# Patient Record
Sex: Female | Born: 1980 | Race: White | Hispanic: No | Marital: Single | Smoking: Current every day smoker
Health system: Southern US, Community
[De-identification: ages and names within clinical notes are randomized; demographics above are authoritative.]

## PROBLEM LIST (undated history)

## (undated) DIAGNOSIS — K760 Fatty (change of) liver, not elsewhere classified: Secondary | ICD-10-CM

## (undated) DIAGNOSIS — R918 Other nonspecific abnormal finding of lung field: Secondary | ICD-10-CM

## (undated) HISTORY — PX: CHOLECYSTECTOMY: SHX55

---

## 2014-03-10 ENCOUNTER — Emergency Department (HOSPITAL_COMMUNITY)
Admission: EM | Admit: 2014-03-10 | Discharge: 2014-03-10 | Disposition: A | Discharge status: Home / Self Care | Payer: No Typology Code available for payment source | Attending: Emergency Medicine | Admitting: Emergency Medicine

## 2014-03-10 ENCOUNTER — Encounter (HOSPITAL_COMMUNITY): Payer: Self-pay | Admitting: Emergency Medicine

## 2014-03-10 ENCOUNTER — Emergency Department (HOSPITAL_COMMUNITY): Payer: No Typology Code available for payment source

## 2014-03-10 DIAGNOSIS — S060X0A Concussion without loss of consciousness, initial encounter: Secondary | ICD-10-CM | POA: Insufficient documentation

## 2014-03-10 DIAGNOSIS — S058X9A Other injuries of unspecified eye and orbit, initial encounter: Secondary | ICD-10-CM | POA: Insufficient documentation

## 2014-03-10 DIAGNOSIS — S6990XA Unspecified injury of unspecified wrist, hand and finger(s), initial encounter: Secondary | ICD-10-CM

## 2014-03-10 DIAGNOSIS — S0180XA Unspecified open wound of other part of head, initial encounter: Secondary | ICD-10-CM | POA: Insufficient documentation

## 2014-03-10 DIAGNOSIS — F17200 Nicotine dependence, unspecified, uncomplicated: Secondary | ICD-10-CM | POA: Insufficient documentation

## 2014-03-10 DIAGNOSIS — Y9389 Activity, other specified: Secondary | ICD-10-CM | POA: Insufficient documentation

## 2014-03-10 DIAGNOSIS — S59909A Unspecified injury of unspecified elbow, initial encounter: Secondary | ICD-10-CM | POA: Insufficient documentation

## 2014-03-10 DIAGNOSIS — S0500XA Injury of conjunctiva and corneal abrasion without foreign body, unspecified eye, initial encounter: Secondary | ICD-10-CM

## 2014-03-10 DIAGNOSIS — S060X9A Concussion with loss of consciousness of unspecified duration, initial encounter: Secondary | ICD-10-CM

## 2014-03-10 DIAGNOSIS — S59919A Unspecified injury of unspecified forearm, initial encounter: Secondary | ICD-10-CM

## 2014-03-10 DIAGNOSIS — H209 Unspecified iridocyclitis: Secondary | ICD-10-CM | POA: Insufficient documentation

## 2014-03-10 DIAGNOSIS — Z79899 Other long term (current) drug therapy: Secondary | ICD-10-CM | POA: Insufficient documentation

## 2014-03-10 DIAGNOSIS — S060XAA Concussion with loss of consciousness status unknown, initial encounter: Secondary | ICD-10-CM

## 2014-03-10 DIAGNOSIS — Y92410 Unspecified street and highway as the place of occurrence of the external cause: Secondary | ICD-10-CM | POA: Insufficient documentation

## 2014-03-10 LAB — I-STAT CHEM 8, ED
BUN: 4 mg/dL — ABNORMAL LOW (ref 6–23)
Calcium, Ion: 1.23 mmol/L (ref 1.12–1.23)
Chloride: 106 mEq/L (ref 96–112)
Creatinine, Ser: 0.8 mg/dL (ref 0.50–1.10)
Glucose, Bld: 80 mg/dL (ref 70–99)
HCT: 41 % (ref 36.0–46.0)
HEMOGLOBIN: 13.9 g/dL (ref 12.0–15.0)
POTASSIUM: 4.3 meq/L (ref 3.7–5.3)
SODIUM: 142 meq/L (ref 137–147)
TCO2: 26 mmol/L (ref 0–100)

## 2014-03-10 MED ORDER — TETRACAINE HCL 0.5 % OP SOLN
1.0000 [drp] | Freq: Once | OPHTHALMIC | Status: AC
  Administered 2014-03-10: 1 [drp] via OPHTHALMIC
  Filled 2014-03-10: qty 2

## 2014-03-10 MED ORDER — HYDROMORPHONE HCL PF 1 MG/ML IJ SOLN
1.0000 mg | Freq: Once | INTRAMUSCULAR | Status: AC
  Administered 2014-03-10: 1 mg via INTRAVENOUS
  Filled 2014-03-10: qty 1

## 2014-03-10 MED ORDER — KETOROLAC TROMETHAMINE 30 MG/ML IJ SOLN
30.0000 mg | Freq: Once | INTRAMUSCULAR | Status: AC
  Administered 2014-03-10: 30 mg via INTRAVENOUS
  Filled 2014-03-10: qty 1

## 2014-03-10 MED ORDER — FLUORESCEIN SODIUM 1 MG OP STRP
1.0000 | ORAL_STRIP | Freq: Once | OPHTHALMIC | Status: AC
  Administered 2014-03-10: 1 via OPHTHALMIC
  Filled 2014-03-10: qty 1

## 2014-03-10 MED ORDER — CYCLOPENTOLATE HCL 1 % OP SOLN
2.0000 [drp] | Freq: Once | OPHTHALMIC | Status: AC
  Administered 2014-03-10: 2 [drp] via OPHTHALMIC
  Filled 2014-03-10: qty 2

## 2014-03-10 MED ORDER — PROCHLORPERAZINE EDISYLATE 5 MG/ML IJ SOLN
10.0000 mg | Freq: Once | INTRAMUSCULAR | Status: AC
  Administered 2014-03-10: 10 mg via INTRAVENOUS
  Filled 2014-03-10: qty 2

## 2014-03-10 MED ORDER — HYDROCODONE-ACETAMINOPHEN 5-325 MG PO TABS: 1.0000 | ORAL_TABLET | Freq: Four times a day (QID) | ORAL | Status: DC | PRN

## 2014-03-10 MED ORDER — ERYTHROMYCIN 5 MG/GM OP OINT: 1.0000 "application " | TOPICAL_OINTMENT | Freq: Four times a day (QID) | OPHTHALMIC | Status: AC

## 2014-03-10 MED ORDER — DIPHENHYDRAMINE HCL 50 MG/ML IJ SOLN
25.0000 mg | Freq: Once | INTRAMUSCULAR | Status: AC
  Administered 2014-03-10: 25 mg via INTRAVENOUS
  Filled 2014-03-10: qty 1

## 2014-03-10 NOTE — Discharge Instructions (Signed)
Iritis °Iritis is an inflammation of the colored part of the eye (iris). Other parts at the front of the eye may also be inflamed. The iris is part of the middle layer of the eyeball which is called the uvea or the uveal track. Any part of the uveal track can become inflamed. The other portions of the uveal track are the choroid (the thin membrane under the outer layer of the eye), and the ciliary body (joins the choroid and the iris and produces the fluid in the front of the eye).  °It is extremely important to treat iritis early, as it may lead to internal eye damage causing scarring or diseases such as glaucoma. Some people have only one attack of iritis (in one or both eyes) in their lifetime, while others may get it many times. °CAUSES °Iritis can be associated with many different diseases, but mostly occurs in otherwise healthy people. Examples of diseases that can be associated with iritis include: °· Diseases where the body's immune system attacks tissues within your own body (autoimmune diseases). °· Infections (tuberculosis, gonorrhea, fungus infections, Lyme disease, infection of the lining of the heart). °· Trauma or injury. °· Eye diseases (acute glaucoma and others). °· Inflammation from other parts of the uveal track. °· Severe eye infections. °· Other rare diseases. °SYMPTOMS °· Eye pain or aching. °· Sensitivity to light. °· Loss of sight or blurred vision. °· Redness of the eye. This is often accompanied by a ring of redness around the outside of the cornea, or clear covering at the front of the eye (ciliary flush). °· Excessive tearing of the eye(s). °· A small pupil that does not enlarge in the dark and stays smaller than the other eye's pupil. °· A whitish area that obscures the lower part of the colored circular iris. Sometimes this is visible when looking at the eye, where the whitish area has a "fluid level" or flat top. This is called a "hypopyon" and is actually pus inside the eye. °Since  iritis causes the eye to become red, it is often confused with a much less dangerous form of "pink eye" or conjunctivitis. One of the most important symptoms is sensitivity to light. Anytime there is redness, discomfort in the eye(s) and extreme light sensitivity, it is extremely important to see an ophthalmologist as soon as possible. °TREATMENT °Acute iritis requires prompt medical evaluation by an eye specialist (ophthalmologist.) Treatment depends on the underlying cause but may include: °· Corticosteroid eye drops and dilating eye drops. Follow your caregiver's exact instructions on taking and stopping corticosteroid medications (drops or pills). °· Occasionally, the iritis will be so severe that it will not respond to commonly used medications. If this happens, it may be necessary to use steroid injections. The injections are given under the eye's outer surface. Sometimes oral medications are given. The decision on treatment used for iritis is usually made on an individual basis. °HOME CARE INSTRUCTIONS °Your care giver will give specific instructions regarding the use of eye medications or other medications. Be certain to follow all instructions in both taking and stopping the medications. °SEEK IMMEDIATE MEDICAL CARE IF: °· You have redness of one or both eye. °· You experience a great deal of light sensitivity. °· You have pain or aching in either eye. °MAKE SURE YOU:  °· Understand these instructions. °· Will watch your condition. °· Will get help right away if you are not doing well or get worse. °Document Released: 09/17/2005 Document Revised: 12/10/2011 Document   Reviewed: 03/07/2007 [employer] Patient Information 2014 [place], [place].  Corneal Abrasion The cornea is the clear covering at the front and center of the eye. When looking at the colored portion of the eye (iris), you are looking through the cornea. This very thin tissue is made up of many layers. The surface layer is a single layer of cells  (corneal epithelium) and is one of the most sensitive tissues in the body. If a scratch or injury causes the corneal epithelium to come off, it is called a corneal abrasion. If the injury extends to the tissues below the epithelium, the condition is called a corneal ulcer. CAUSES   Scratches.  Trauma.  Foreign body in the eye. Some people have recurrences of abrasions in the area of the original injury even after it has healed (recurrent erosion syndrome). Recurrent erosion syndrome generally improves and goes away with time. SYMPTOMS   Eye pain.  Difficulty or inability to keep the injured eye open.  The eye becomes very sensitive to light.  Recurrent erosions tend to happen suddenly, first thing in the morning, usually after waking up and opening the eye. DIAGNOSIS  Your health care provider can diagnose a corneal abrasion during an eye exam. Dye is usually placed in the eye using a drop or a small paper strip moistened by your tears. When the eye is examined with a special light, the abrasion shows up clearly because of the dye. TREATMENT   Small abrasions may be treated with antibiotic drops or ointment alone.  Usually a pressure patch is specially applied. Pressure patches prevent the eye from blinking, allowing the corneal epithelium to heal. A pressure patch also reduces the amount of pain present in the eye during healing. Most corneal abrasions heal within 2 3 days with no effect on vision. If the abrasion becomes infected and spreads to the deeper tissues of the cornea, a corneal ulcer can result. This is serious because it can cause corneal scarring. Corneal scars interfere with light passing through the cornea and cause a loss of vision in the involved eye. HOME CARE INSTRUCTIONS  Use medicine or ointment as directed. Only take over-the-counter or prescription medicines for pain, discomfort, or fever as directed by your health care provider.  Do not drive or operate  machinery while your eye is patched. Your ability to judge distances is impaired.  If your health care provider has given you a follow-up appointment, it is very important to keep that appointment. Not keeping the appointment could result in a severe eye infection or permanent loss of vision. If there is any problem keeping the appointment, let your health care provider know. SEEK MEDICAL CARE IF:   You have pain, light sensitivity, and a scratchy feeling in one eye or both eyes.  Your pressure patch keeps loosening up, and you can blink your eye under the patch after treatment.  Any kind of discharge develops from the eye after treatment or if the lids stick together in the morning.  You have the same symptoms in the morning as you did with the original abrasion days, weeks, or months after the abrasion healed. MAKE SURE YOU:   Understand these instructions.  Will watch your condition.  Will get help right away if you are not doing well or get worse. Document Released: 09/14/2000 Document Revised: 07/08/2013 Document Reviewed: 05/25/2013 [facility] Patient Information 2014 [place], [place].  Concussion, Adult A concussion, or closed-head injury, is a brain injury caused by a direct blow to the  head or by a quick and sudden movement (jolt) of the head or neck. Concussions are usually not life-threatening. Even so, the effects of a concussion can be serious. If you have had a concussion before, you are more likely to experience concussion-like symptoms after a direct blow to the head.  CAUSES   Direct blow to the head, such as from running into another player during a soccer game, being hit in a fight, or hitting your head on a hard surface.  A jolt of the head or neck that causes the brain to move back and forth inside the skull, such as in a car crash. SIGNS AND SYMPTOMS  The signs of a concussion can be hard to notice. Early on, they may be missed by you, family members, and health care  providers. You may look fine but act or feel differently. Symptoms are usually temporary, but they may last for days, weeks, or even longer. Some symptoms may appear right away while others may not show up for hours or days. Every head injury is different. Symptoms include:   Mild to moderate headaches that will not go away.  A feeling of pressure inside your head.  Having more trouble than usual:   Learning or remembering things you have heard.  Answering questions.  Paying attention or concentrating.   Organizing daily tasks.   Making decisions and solving problems.   Slowness in thinking, acting or reacting, speaking, or reading.   Getting lost or being easily confused.   Feeling tired all the time or lacking energy (fatigued).   Feeling drowsy.   Sleep disturbances.   Sleeping more than usual.   Sleeping less than usual.   Trouble falling asleep.   Trouble sleeping (insomnia).   Loss of balance or feeling lightheaded or dizzy.   Nausea or vomiting.   Numbness or tingling.   Increased sensitivity to:   Sounds.   Lights.   Distractions.   Vision problems or eyes that tire easily.   Diminished sense of taste or smell.   Ringing in the ears.   Mood changes such as feeling sad or anxious.   Becoming easily irritated or angry for little or no reason.   Lack of motivation.  Seeing or hearing things other people do not see or hear (hallucinations). DIAGNOSIS  Your health care provider can usually diagnose a concussion based on a description of your injury and symptoms. He or she will ask whether you passed out (lost consciousness) and whether you are having trouble remembering events that happened right before and during your injury.  Your evaluation might include:   A brain scan to look for signs of injury to the brain. Even if the test shows no injury, you may still have a concussion.   Blood tests to be sure other  problems are not present. TREATMENT   Concussions are usually treated in an emergency department, in urgent care, or at a clinic. You may need to stay in the hospital overnight for further treatment.   Tell your health care provider if you are taking any medicines, including prescription medicines, over-the-counter medicines, and natural remedies. Some medicines, such as blood thinners (anticoagulants) and aspirin, may increase the chance of complications. Also tell your health care provider whether you have had alcohol or are taking illegal drugs. This information may affect treatment.  Your health care provider will send you home with important instructions to follow.  How fast you will recover from a concussion depends on  many factors. These factors include how severe your concussion is, what part of your brain was injured, your age, and how healthy you were before the concussion.  Most people with mild injuries recover fully. Recovery can take time. In general, recovery is slower in older persons. Also, persons who have had a concussion in the past or have other medical problems may find that it takes longer to recover from their current injury. HOME CARE INSTRUCTIONS  General Instructions  Carefully follow the directions your health care provider gave you.  Only take over-the-counter or prescription medicines for pain, discomfort, or fever as directed by your health care provider.  Take only those medicines that your health care provider has approved.  Do not drink alcohol until your health care provider says you are well enough to do so. Alcohol and certain other drugs may slow your recovery and can put you at risk of further injury.  If it is harder than usual to remember things, write them down.  If you are easily distracted, try to do one thing at a time. For example, do not try to watch TV while fixing dinner.  Talk with family members or close friends when making important  decisions.  Keep all follow-up appointments. Repeated evaluation of your symptoms is recommended for your recovery.  Watch your symptoms and tell others to do the same. Complications sometimes occur after a concussion. Older adults with a brain injury may have a higher risk of serious complications such as of a blood clot on the brain.  Tell your teachers, school nurse, school counselor, coach, athletic trainer, or work Production designer, theatre/television/film about your injury, symptoms, and restrictions. Tell them about what you can or cannot do. They should watch for:   Increased problems with attention or concentration.   Increased difficulty remembering or learning new information.   Increased time needed to complete tasks or assignments.   Increased irritability or decreased ability to cope with stress.   Increased symptoms.   Rest. Rest helps the brain to heal. Make sure you:  Get plenty of sleep at night. Avoid staying up late at night.  Keep the same bedtime hours on weekends and weekdays.  Rest during the day. Take daytime naps or rest breaks when you feel tired.  Limit activities that require a lot of thought or concentration. These includes   Doing homework or job-related work.   Watching TV.   Working on the computer.  Avoid any situation where there is potential for another head injury (football, hockey, soccer, basketball, martial arts, downhill snow sports and horseback riding). Your condition will get worse every time you experience a concussion. You should avoid these activities until you are evaluated by the appropriate follow-up caregivers. Returning To Your Regular Activities You will need to return to your normal activities slowly, not all at once. You must give your body and brain enough time for recovery.  Do not return to sports or other athletic activities until your health care provider tells you it is safe to do so.  Ask your health care provider when you can drive, ride a  bicycle, or operate heavy machinery. Your ability to react may be slower after a brain injury. Never do these activities if you are dizzy.  Ask your health care provider about when you can return to work or school. Preventing Another Concussion It is very important to avoid another brain injury, especially before you have recovered. In rare cases, another injury can lead to permanent brain  damage, brain swelling, or death. The risk of this is greatest during the first 7 10 days after a head injury. Avoid injuries by:   Wearing a seat belt when riding in a car.   Drinking alcohol only in moderation.   Wearing a helmet when biking, skiing, skateboarding, skating, or doing similar activities.  Avoiding activities that could lead to a second concussion, such as contact or recreational sports, until your health care provider says it is OK.  Taking safety measures in your home.   Remove clutter and tripping hazards from floors and stairways.   Use grab bars in bathrooms and handrails by stairs.   Place non-slip mats on floors and in bathtubs.   Improve lighting in dim areas. SEEK MEDICAL CARE IF:   You have increased problems paying attention or concentrating.   You have increased difficulty remembering or learning new information.   You need more time to complete tasks or assignments than before.   You have increased irritability or decreased ability to cope with stress.  You have more symptoms than before. Seek medical care if you have any of the following symptoms for more than 2 weeks after your injury:   Lasting (chronic) headaches.   Dizziness or balance problems.   Nausea.  Vision problems.   Increased sensitivity to noise or light.   Depression or mood swings.   Anxiety or irritability.   Memory problems.   Difficulty concentrating or paying attention.   Sleep problems.   Feeling tired all the time. SEEK IMMEDIATE MEDICAL CARE IF:   You  have severe or worsening headaches. These may be a sign of a blood clot in the brain.  You have weakness (even if only in one hand, leg, or part of the face).  You have numbness.  You have decreased coordination.   You vomit repeatedly.  You have increased sleepiness.  One pupil is larger than the other.   You have convulsions.   You have slurred speech.   You have increased confusion. This may be a sign of a blood clot in the brain.  You have increased restlessness, agitation, or irritability.   You are unable to recognize people or places.   You have neck pain.   It is difficult to wake you up.   You have unusual behavior changes.   You lose consciousness. MAKE SURE YOU:   Understand these instructions.  Will watch your condition.  Will get help right away if you are not doing well or get worse. Document Released: 12/08/2003 Document Revised: 05/20/2013 Document Reviewed: 04/09/2013 [facility] Patient Information 2014 [place], [place].
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
## 2014-03-10 NOTE — ED Notes (Addendum)
Eye drops administered prior to discharge. Eye pad placed over left eye and secured. AVS explained thoroughly. Advised to return if eye were to drain continuously and advised to follow up with opthalmology. Ambulatory with steady gait. No other complaints at this time.
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
## 2014-03-10 NOTE — ED Notes (Signed)
Bed: WA22 Expected date:  Expected time:  Means of arrival:  Comments: 

## 2014-03-10 NOTE — ED Notes (Signed)
Pt reports decreased pain to head and reports my wrist/forearm "is okay."

## 2014-03-10 NOTE — ED Notes (Addendum)
PA at bedside assessing vision/eye. Will draw lab when complete.

## 2014-03-10 NOTE — ED Notes (Signed)
Patient returned from CT

## 2014-03-12 ENCOUNTER — Emergency Department (HOSPITAL_COMMUNITY)
Admission: EM | Admit: 2014-03-12 | Discharge: 2014-03-12 | Disposition: A | Discharge status: Home / Self Care | Payer: No Typology Code available for payment source | Attending: Emergency Medicine | Admitting: Emergency Medicine

## 2014-03-12 ENCOUNTER — Encounter (HOSPITAL_COMMUNITY): Payer: Self-pay | Admitting: Emergency Medicine

## 2014-03-12 DIAGNOSIS — R51 Headache: Secondary | ICD-10-CM | POA: Insufficient documentation

## 2014-03-12 DIAGNOSIS — H209 Unspecified iridocyclitis: Secondary | ICD-10-CM | POA: Insufficient documentation

## 2014-03-12 DIAGNOSIS — Z79899 Other long term (current) drug therapy: Secondary | ICD-10-CM | POA: Insufficient documentation

## 2014-03-12 DIAGNOSIS — F17200 Nicotine dependence, unspecified, uncomplicated: Secondary | ICD-10-CM | POA: Insufficient documentation

## 2014-03-12 MED ORDER — METOCLOPRAMIDE HCL 5 MG/ML IJ SOLN
10.0000 mg | Freq: Once | INTRAMUSCULAR | Status: AC
Start: 2014-03-12 — End: 2014-03-12
  Administered 2014-03-12: 10 mg via INTRAVENOUS
  Filled 2014-03-12: qty 2

## 2014-03-12 MED ORDER — HYDROMORPHONE HCL PF 1 MG/ML IJ SOLN
1.0000 mg | INTRAMUSCULAR | Status: DC | PRN
  Administered 2014-03-12: 1 mg via INTRAVENOUS
  Filled 2014-03-12: qty 1

## 2014-03-12 MED ORDER — SODIUM CHLORIDE 0.9 % IV SOLN
INTRAVENOUS | Status: DC
  Administered 2014-03-12: 09:00:00 via INTRAVENOUS

## 2014-03-12 NOTE — Discharge Instructions (Signed)
Iritis °Iritis is an inflammation of the colored part of the eye (iris). Other parts at the front of the eye may also be inflamed. The iris is part of the middle layer of the eyeball which is called the uvea or the uveal track. Any part of the uveal track can become inflamed. The other portions of the uveal track are the choroid (the thin membrane under the outer layer of the eye), and the ciliary body (joins the choroid and the iris and produces the fluid in the front of the eye).  °It is extremely important to treat iritis early, as it may lead to internal eye damage causing scarring or diseases such as glaucoma. Some people have only one attack of iritis (in one or both eyes) in their lifetime, while others may get it many times. °CAUSES °Iritis can be associated with many different diseases, but mostly occurs in otherwise healthy people. Examples of diseases that can be associated with iritis include: °· Diseases where the body's immune system attacks tissues within your own body (autoimmune diseases). °· Infections (tuberculosis, gonorrhea, fungus infections, Lyme disease, infection of the lining of the heart). °· Trauma or injury. °· Eye diseases (acute glaucoma and others). °· Inflammation from other parts of the uveal track. °· Severe eye infections. °· Other rare diseases. °SYMPTOMS °· Eye pain or aching. °· Sensitivity to light. °· Loss of sight or blurred vision. °· Redness of the eye. This is often accompanied by a ring of redness around the outside of the cornea, or clear covering at the front of the eye (ciliary flush). °· Excessive tearing of the eye(s). °· A small pupil that does not enlarge in the dark and stays smaller than the other eye's pupil. °· A whitish area that obscures the lower part of the colored circular iris. Sometimes this is visible when looking at the eye, where the whitish area has a "fluid level" or flat top. This is called a "hypopyon" and is actually pus inside the eye. °Since  iritis causes the eye to become red, it is often confused with a much less dangerous form of "pink eye" or conjunctivitis. One of the most important symptoms is sensitivity to light. Anytime there is redness, discomfort in the eye(s) and extreme light sensitivity, it is extremely important to see an ophthalmologist as soon as possible. °TREATMENT °Acute iritis requires prompt medical evaluation by an eye specialist (ophthalmologist.) Treatment depends on the underlying cause but may include: °· Corticosteroid eye drops and dilating eye drops. Follow your caregiver's exact instructions on taking and stopping corticosteroid medications (drops or pills). °· Occasionally, the iritis will be so severe that it will not respond to commonly used medications. If this happens, it may be necessary to use steroid injections. The injections are given under the eye's outer surface. Sometimes oral medications are given. The decision on treatment used for iritis is usually made on an individual basis. °HOME CARE INSTRUCTIONS °Your care giver will give specific instructions regarding the use of eye medications or other medications. Be certain to follow all instructions in both taking and stopping the medications. °SEEK IMMEDIATE MEDICAL CARE IF: °· You have redness of one or both eye. °· You experience a great deal of light sensitivity. °· You have pain or aching in either eye. °MAKE SURE YOU:  °· Understand these instructions. °· Will watch your condition. °· Will get help right away if you are not doing well or get worse. °Document Released: 09/17/2005 Document Revised: 12/10/2011 Document   Reviewed: 03/07/2007 °ExitCare® Patient Information ©2014 [employer]. ° °
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
## 2014-03-12 NOTE — ED Provider Notes (Signed)
Medical screening examination/treatment/procedure(s) were performed by non-physician practitioner and as supervising physician I was immediately available for consultation/collaboration.   EKG Interpretation None        Gilda Creasehristopher J. Pollina, MD 03/12/14 1538

## 2014-03-20 ENCOUNTER — Encounter (HOSPITAL_COMMUNITY): Payer: Self-pay | Admitting: Emergency Medicine

## 2014-03-20 ENCOUNTER — Emergency Department (HOSPITAL_COMMUNITY): Payer: No Typology Code available for payment source

## 2014-03-20 ENCOUNTER — Emergency Department (HOSPITAL_COMMUNITY)
Admission: EM | Admit: 2014-03-20 | Discharge: 2014-03-20 | Disposition: A | Discharge status: Home / Self Care | Payer: No Typology Code available for payment source | Attending: Emergency Medicine | Admitting: Emergency Medicine

## 2014-03-20 DIAGNOSIS — R1013 Epigastric pain: Secondary | ICD-10-CM

## 2014-03-20 DIAGNOSIS — Z792 Long term (current) use of antibiotics: Secondary | ICD-10-CM | POA: Insufficient documentation

## 2014-03-20 DIAGNOSIS — Z79899 Other long term (current) drug therapy: Secondary | ICD-10-CM | POA: Insufficient documentation

## 2014-03-20 DIAGNOSIS — F17200 Nicotine dependence, unspecified, uncomplicated: Secondary | ICD-10-CM | POA: Insufficient documentation

## 2014-03-20 DIAGNOSIS — Z3202 Encounter for pregnancy test, result negative: Secondary | ICD-10-CM | POA: Insufficient documentation

## 2014-03-20 DIAGNOSIS — K279 Peptic ulcer, site unspecified, unspecified as acute or chronic, without hemorrhage or perforation: Secondary | ICD-10-CM | POA: Insufficient documentation

## 2014-03-20 LAB — COMPREHENSIVE METABOLIC PANEL
ALT: 11 U/L (ref 0–35)
AST: 14 U/L (ref 0–37)
Albumin: 4.3 g/dL (ref 3.5–5.2)
Alkaline Phosphatase: 61 U/L (ref 39–117)
BUN: 6 mg/dL (ref 6–23)
CHLORIDE: 103 meq/L (ref 96–112)
CO2: 21 meq/L (ref 19–32)
Calcium: 9.7 mg/dL (ref 8.4–10.5)
Creatinine, Ser: 0.7 mg/dL (ref 0.50–1.10)
GFR calc Af Amer: >90 mL/min (ref >90)
GFR calc non Af Amer: >90 mL/min (ref >90)
Glucose, Bld: 74 mg/dL (ref 70–99)
Potassium: 3.7 mEq/L (ref 3.7–5.3)
SODIUM: 139 meq/L (ref 137–147)
TOTAL PROTEIN: 7.1 g/dL (ref 6.0–8.3)
Total Bilirubin: <0.2 mg/dL — ABNORMAL LOW (ref 0.3–1.2)

## 2014-03-20 LAB — URINALYSIS, ROUTINE W REFLEX MICROSCOPIC
Bilirubin Urine: NEGATIVE
Glucose, UA: NEGATIVE mg/dL
Ketones, ur: NEGATIVE mg/dL
Leukocytes, UA: NEGATIVE
NITRITE: NEGATIVE
PH: 6 (ref 5.0–8.0)
Protein, ur: NEGATIVE mg/dL
Specific Gravity, Urine: 1.005 (ref 1.005–1.030)
Urobilinogen, UA: 0.2 mg/dL (ref 0.0–1.0)

## 2014-03-20 LAB — CBC WITH DIFFERENTIAL/PLATELET
Basophils Absolute: 0.1 10*3/uL (ref 0.0–0.1)
Basophils Relative: 1 % (ref 0–1)
EOS ABS: 0.1 10*3/uL (ref 0.0–0.7)
Eosinophils Relative: 1 % (ref 0–5)
HCT: 40.2 % (ref 36.0–46.0)
HEMOGLOBIN: 13.3 g/dL (ref 12.0–15.0)
LYMPHS ABS: 2.3 10*3/uL (ref 0.7–4.0)
LYMPHS PCT: 21 % (ref 12–46)
MCH: 30.6 pg (ref 26.0–34.0)
MCHC: 33.1 g/dL (ref 30.0–36.0)
MCV: 92.4 fL (ref 78.0–100.0)
Monocytes Absolute: 0.9 10*3/uL (ref 0.1–1.0)
Monocytes Relative: 8 % (ref 3–12)
NEUTROS ABS: 7.5 10*3/uL (ref 1.7–7.7)
NEUTROS PCT: 69 % (ref 43–77)
Platelets: 270 10*3/uL (ref 150–400)
RBC: 4.35 MIL/uL (ref 3.87–5.11)
RDW: 13.9 % (ref 11.5–15.5)
WBC: 10.9 10*3/uL — ABNORMAL HIGH (ref 4.0–10.5)

## 2014-03-20 LAB — URINE MICROSCOPIC-ADD ON

## 2014-03-20 LAB — POC URINE PREG, ED: Preg Test, Ur: NEGATIVE

## 2014-03-20 LAB — LIPASE, BLOOD: Lipase: 40 U/L (ref 11–59)

## 2014-03-20 MED ORDER — MORPHINE SULFATE 4 MG/ML IJ SOLN
4.0000 mg | Freq: Once | INTRAMUSCULAR | Status: AC
  Administered 2014-03-20: 4 mg via INTRAVENOUS
  Filled 2014-03-20: qty 1

## 2014-03-20 MED ORDER — HYDROCODONE-ACETAMINOPHEN 5-325 MG PO TABS: 1.0000 | ORAL_TABLET | ORAL | Status: DC | PRN

## 2014-03-20 MED ORDER — PROMETHAZINE HCL 25 MG/ML IJ SOLN
12.5000 mg | Freq: Once | INTRAMUSCULAR | Status: AC
  Administered 2014-03-20: 12.5 mg via INTRAVENOUS
  Filled 2014-03-20: qty 1

## 2014-03-20 MED ORDER — FAMOTIDINE IN NACL 20-0.9 MG/50ML-% IV SOLN
20.0000 mg | Freq: Once | INTRAVENOUS | Status: AC
  Administered 2014-03-20: 20 mg via INTRAVENOUS
  Filled 2014-03-20: qty 50

## 2014-03-20 MED ORDER — METOCLOPRAMIDE HCL 5 MG/ML IJ SOLN
10.0000 mg | Freq: Once | INTRAMUSCULAR | Status: AC
  Administered 2014-03-20: 10 mg via INTRAVENOUS
  Filled 2014-03-20: qty 2

## 2014-03-20 MED ORDER — MORPHINE SULFATE 4 MG/ML IJ SOLN
4.0000 mg | INTRAMUSCULAR | Status: DC | PRN
  Administered 2014-03-20: 4 mg via INTRAVENOUS
  Filled 2014-03-20: qty 1

## 2014-03-20 MED ORDER — PROMETHAZINE HCL 12.5 MG PO TABS: 12.5000 mg | ORAL_TABLET | Freq: Four times a day (QID) | ORAL | Status: AC | PRN

## 2014-03-20 MED ORDER — SUCRALFATE 1 G PO TABS: 1.0000 g | ORAL_TABLET | Freq: Four times a day (QID) | ORAL | Status: AC

## 2014-03-20 MED ORDER — OMEPRAZOLE 20 MG PO CPDR: 20.0000 mg | DELAYED_RELEASE_CAPSULE | Freq: Two times a day (BID) | ORAL | Status: AC

## 2014-03-20 NOTE — ED Notes (Addendum)
Per pt, states RUQ pain radiating to back which started yesterday-states increased urination-states she has noticed blood when she wiped

## 2014-03-20 NOTE — Discharge Instructions (Signed)
Recheck in emergency room if your pain becomes worse. Recheck with any vomiting of blood, or passage of blood in your stool. [facility] the above information for a followup appointment  Abdominal Pain, Women Abdominal (stomach, pelvic, or belly) pain can be caused by many things. It is important to tell your doctor:  The location of the pain.  Does it come and go or is it present all the time?  Are there things that start the pain (eating certain foods, exercise)?  Are there other symptoms associated with the pain (fever, nausea, vomiting, diarrhea)? All of this is helpful to know when trying to find the cause of the pain. CAUSES   Stomach: virus or bacteria infection, or ulcer.  Intestine: appendicitis (inflamed appendix), regional ileitis (Crohn's disease), ulcerative colitis (inflamed colon), irritable bowel syndrome, diverticulitis (inflamed diverticulum of the colon), or cancer of the stomach or intestine.  Gallbladder disease or stones in the gallbladder.  Kidney disease, kidney stones, or infection.  Pancreas infection or cancer.  Fibromyalgia (pain disorder).  Diseases of the female organs:  Uterus: fibroid (non-cancerous) tumors or infection.  Fallopian tubes: infection or tubal pregnancy.  Ovary: cysts or tumors.  Pelvic adhesions (scar tissue).  Endometriosis (uterus lining tissue growing in the pelvis and on the pelvic organs).  Pelvic congestion syndrome (female organs filling up with blood just before the menstrual period).  Pain with the menstrual period.  Pain with ovulation (producing an egg).  Pain with an IUD (intrauterine device, birth control) in the uterus.  Cancer of the female organs.  Functional pain (pain not caused by a disease, may improve without treatment).  Psychological pain.  Depression. DIAGNOSIS  Your doctor will decide the seriousness of your pain by doing an examination.  Blood  tests.  X-rays.  Ultrasound.  CT scan (computed tomography, special type of X-ray).  MRI (magnetic resonance imaging).  Cultures, for infection.  Barium enema (dye inserted in the large intestine, to better view it with X-rays).  Colonoscopy (looking in intestine with a lighted tube).  Laparoscopy (minor surgery, looking in abdomen with a lighted tube).  Major abdominal exploratory surgery (looking in abdomen with a large incision). TREATMENT  The treatment will depend on the cause of the pain.   Many cases can be observed and treated at home.  Over-the-counter medicines recommended by your caregiver.  Prescription medicine.  Antibiotics, for infection.  Birth control pills, for painful periods or for ovulation pain.  Hormone treatment, for endometriosis.  Nerve blocking injections.  Physical therapy.  Antidepressants.  Counseling with a psychologist or psychiatrist.  Minor or major surgery. HOME CARE INSTRUCTIONS   Do not take laxatives, unless directed by your caregiver.  Take over-the-counter pain medicine only if ordered by your caregiver. Do not take aspirin because it can cause an upset stomach or bleeding.  Try a clear liquid diet (broth or water) as ordered by your caregiver. Slowly move to a bland diet, as tolerated, if the pain is related to the stomach or intestine.  Have a thermometer and take your temperature several times a day, and record it.  Bed rest and sleep, if it helps the pain.  Avoid sexual intercourse, if it causes pain.  Avoid stressful situations.  Keep your follow-up appointments and tests, as your caregiver orders.  If the pain does not go away with medicine or surgery, you may try:  Acupuncture.  Relaxation exercises (yoga, meditation).  Group therapy.  Counseling. SEEK MEDICAL CARE IF:  You notice certain foods cause stomach pain.  Your home care treatment is not helping your pain.  You need stronger pain  medicine.  You want your IUD removed.  You feel faint or lightheaded.  You develop nausea and vomiting.  You develop a rash.  You are having side effects or an allergy to your medicine. SEEK IMMEDIATE MEDICAL CARE IF:   Your pain does not go away or gets worse.  You have a fever.  Your pain is felt only in portions of the abdomen. The right side could possibly be appendicitis. The left lower portion of the abdomen could be colitis or diverticulitis.  You are passing blood in your stools (bright red or black tarry stools, with or without vomiting).  You have blood in your urine.  You develop chills, with or without a fever.  You pass out. MAKE SURE YOU:   Understand these instructions.  Will watch your condition.  Will get help right away if you are not doing well or get worse. Document Released: 07/15/2007 Document Revised: 12/10/2011 Document Reviewed: 08/04/2009 [facility] Patient Information 2015 [place], [place]. This information is not intended to replace advice given to you by your health care provider. Make sure you discuss any questions you have with your health care provider.  Peptic Ulcer A peptic ulcer is a painful sore in the lining of in your esophagus, stomach, or in the first part of your small intestine. The main causes of an ulcer can be:  An infection.  Using certain pain medicines too often or too much.  Smoking. HOME CARE  Avoid smoking, alcohol, and caffeine.  Avoid foods that bother you.  Only take medicine as told by your doctor. Do not take any medicines your doctor has not approved.  Keep all doctor visits as told. GET HELP RIGHT AWAY IF:  You do not get better in 7 days after starting treatment.  You keep having an upset stomach (indigestion) or heartburn.  You have sudden, sharp, or lasting belly (abdominal) pain.  You have bloody, black, or tarry poop (stool).  You throw up (vomit) blood or your throw up looks like coffee  grounds.  You get light headed, weak, or feel like you will pass out (faint).  You get sweaty or feel sticky and cold to the touch (clammy). MAKE SURE YOU:   Understand these instructions.  Will watch your condition.  Will get help right away if you are not doing well or get worse. Document Released: 12/12/2009 Document Revised: 06/11/2012 Document Reviewed: 04/16/2012 [facility] Patient Information 2015 [place], [place]. This information is not intended to replace advice given to you by your health care provider. Make sure you discuss any questions you have with your health care provider.

## 2014-03-20 NOTE — ED Provider Notes (Signed)
CSN: [id number]     Arrival date & time 03/20/14  1038 History   First MD Initiated Contact with Patient 03/20/14 1050     Chief Complaint  Patient presents with  . Abdominal Pain     HPI  Symptoms of right upper quadrant abdominal pain. Been present today. Present on awakening yesterday morning. Has had some nausea no vomiting. Normal bowel habits without diarrhea. No dysuria C. or hematuria. He states a spicy foods large meals or anything fried gives her pain in her upper abdomen. No other stools. Has not been having dark urine or light stools.  History reviewed. No pertinent past medical history. History reviewed. No pertinent past surgical history. No family history on file. History  Substance Use Topics  . Smoking status: Current Every Day Smoker -- 15 years    Types: Cigarettes  . Smokeless tobacco: Not on file  . Alcohol Use: No   OB History   Grav Para Term Preterm Abortions TAB SAB Ect Mult Living                 Review of Systems  Constitutional: Negative for fever, chills, diaphoresis, appetite change and fatigue.  HENT: Negative for mouth sores, sore throat and trouble swallowing.   Eyes: Negative for visual disturbance.  Respiratory: Negative for cough, chest tightness, shortness of breath and wheezing.   Cardiovascular: Negative for chest pain.  Gastrointestinal: Positive for nausea and abdominal pain. Negative for vomiting, diarrhea and abdominal distention.  Endocrine: Negative for polydipsia, polyphagia and polyuria.  Genitourinary: Negative for dysuria, frequency and hematuria.  Musculoskeletal: Negative for gait problem.  Skin: Negative for color change, pallor and rash.  Neurological: Negative for dizziness, syncope, light-headedness and headaches.  Hematological: Does not bruise/bleed easily.  Psychiatric/Behavioral: Negative for behavioral problems and confusion.      Allergies  Zofran  Home Medications   Prior to Admission medications     Medication Sig Start Date End Date Taking? Authorizing Provider  aspirin-acetaminophen-caffeine (EXCEDRIN MIGRAINE) [phone] MG per tablet Take 1 tablet by mouth every 6 (six) hours as needed for headache.   Yes Historical Provider, MD  Aspirin-Salicylamide-Caffeine (BC HEADACHE POWDER PO) Take 2 packets by mouth every 4 (four) hours as needed (for pain).   Yes Historical Provider, MD  erythromycin ophthalmic ointment Place 1 application into the left eye every 6 (six) hours. Place 1/2 inch ribbon of ointment in the affected eye 4 times a day 03/10/14  Yes Arthor CaptainAbigail Harris, PA-C  HYDROcodone-acetaminophen (NORCO) 5-325 MG per tablet Take 1-2 tablets by mouth every 6 (six) hours as needed for moderate pain. 03/10/14  Yes Arthor CaptainAbigail Harris, PA-C  ibuprofen (ADVIL,MOTRIN) 200 MG tablet Take 800 mg by mouth every 6 (six) hours as needed for mild pain or moderate pain.   Yes Historical Provider, MD  promethazine (PHENERGAN) 25 MG tablet Take 25 mg by mouth every 6 (six) hours as needed for nausea or vomiting.   Yes Historical Provider, MD  ranitidine (ZANTAC) 150 MG tablet Take 150 mg by mouth 2 (two) times daily.   Yes Historical Provider, MD  HYDROcodone-acetaminophen (NORCO/VICODIN) 5-325 MG per tablet Take 1 tablet by mouth every 4 (four) hours as needed. 03/20/14   Rolland PorterMark Rashed Edler, MD  omeprazole (PRILOSEC) 20 MG capsule Take 1 capsule (20 mg total) by mouth 2 (two) times daily. 03/20/14   Rolland PorterMark Catarina Huntley, MD  promethazine (PHENERGAN) 12.5 MG tablet Take 1 tablet (12.5 mg total) by mouth every 6 (six) hours as needed for nausea  or vomiting. 03/20/14   Rolland PorterMark Winefred Hillesheim, MD  sucralfate (CARAFATE) 1 G tablet Take 1 tablet (1 g total) by mouth 4 (four) times daily. 03/20/14   Rolland PorterMark Yomaris Palecek, MD   BP 96/65  Pulse 54  Temp(Src) 98.2 F (36.8 C) (Oral)  Resp 20  SpO2 100%  LMP 03/03/2014 Physical Exam  Constitutional: She is oriented to person, place, and time. She appears well-developed and well-nourished. No distress.  HENT:   Head: Normocephalic.  Eyes: Conjunctivae are normal. Pupils are equal, round, and reactive to light. No scleral icterus.  Neck: Normal range of motion. Neck supple. No thyromegaly present.  Cardiovascular: Normal rate and regular rhythm.  Exam reveals no gallop and no friction rub.   No murmur heard. Pulmonary/Chest: Effort normal and breath sounds normal. No respiratory distress. She has no wheezes. She has no rales.  Abdominal: Soft. Bowel sounds are normal. She exhibits no distension. There is tenderness in the right upper quadrant and epigastric area. There is no rebound.    Musculoskeletal: Normal range of motion.  Neurological: She is alert and oriented to person, place, and time.  Skin: Skin is warm and dry. No rash noted.  Psychiatric: She has a normal mood and affect. Her behavior is normal.    ED Course  Procedures (including critical care time) Labs Review Labs Reviewed  CBC WITH DIFFERENTIAL - Abnormal; Notable for the following:    WBC 10.9 (*)    All other components within normal limits  COMPREHENSIVE METABOLIC PANEL - Abnormal; Notable for the following:    Total Bilirubin <0.2 (*)    All other components within normal limits  URINALYSIS, ROUTINE W REFLEX MICROSCOPIC - Abnormal; Notable for the following:    APPearance CLOUDY (*)    Hgb urine dipstick SMALL (*)    All other components within normal limits  URINE MICROSCOPIC-ADD ON - Abnormal; Notable for the following:    Squamous Epithelial / LPF MANY (*)    All other components within normal limits  LIPASE, BLOOD  POC URINE PREG, ED    Imaging Review [place] Abdomen Limited Ruq  03/20/2014   CLINICAL DATA:  RUQ PAIN,? Cholecystitis  EXAM: US ABDOMEN LIMITED - RIGHT UPPER QUADRANT  COMPARISON:  None.  FINDINGS: Gallbladder:  No gallstones or wall thickening visualized, measuring 1.6 mm. No sonographic Murphy sign noted.  Common bile duct:  Diameter: 2.6 mm  Liver:  No focal lesion identified. Within normal limits  in parenchymal echogenicity.  IMPRESSION: Negative right upper quadrant ultrasound.   Electronically Signed   By: Salome HolmesHector  Cooper M.D.   On: 03/20/2014 12:44     EKG Interpretation None      MDM   Final diagnoses:  Epigastric pain  Peptic ulcer disease   Is improving. Labs and ultrasound are reassuring. Diagnosis is abdominal pain, probable peptic ulcer disease. The proton pump inhibitors, Carafate, pain meds, GI followup if not improving. Return precautions discussed.     Rolland PorterMark Ladarryl Wrage, MD 03/20/14 1409

## 2014-12-27 ENCOUNTER — Emergency Department (HOSPITAL_COMMUNITY)
Admission: EM | Admit: 2014-12-27 | Discharge: 2014-12-28 | Disposition: A | Discharge status: Home / Self Care | Payer: Self-pay | Attending: Emergency Medicine | Admitting: Emergency Medicine

## 2014-12-27 ENCOUNTER — Encounter (HOSPITAL_COMMUNITY): Payer: Self-pay | Admitting: Emergency Medicine

## 2014-12-27 ENCOUNTER — Emergency Department (HOSPITAL_COMMUNITY): Payer: No Typology Code available for payment source

## 2014-12-27 DIAGNOSIS — R202 Paresthesia of skin: Secondary | ICD-10-CM

## 2014-12-27 DIAGNOSIS — M79602 Pain in left arm: Secondary | ICD-10-CM | POA: Insufficient documentation

## 2014-12-27 DIAGNOSIS — M549 Dorsalgia, unspecified: Secondary | ICD-10-CM | POA: Insufficient documentation

## 2014-12-27 DIAGNOSIS — Z8719 Personal history of other diseases of the digestive system: Secondary | ICD-10-CM | POA: Insufficient documentation

## 2014-12-27 DIAGNOSIS — M79601 Pain in right arm: Secondary | ICD-10-CM | POA: Insufficient documentation

## 2014-12-27 DIAGNOSIS — R200 Anesthesia of skin: Secondary | ICD-10-CM | POA: Insufficient documentation

## 2014-12-27 DIAGNOSIS — Z79899 Other long term (current) drug therapy: Secondary | ICD-10-CM | POA: Insufficient documentation

## 2014-12-27 DIAGNOSIS — Z720 Tobacco use: Secondary | ICD-10-CM | POA: Insufficient documentation

## 2014-12-27 DIAGNOSIS — Z3202 Encounter for pregnancy test, result negative: Secondary | ICD-10-CM | POA: Insufficient documentation

## 2014-12-27 HISTORY — DX: Fatty (change of) liver, not elsewhere classified: K76.0

## 2014-12-27 HISTORY — DX: Other nonspecific abnormal finding of lung field: R91.8

## 2014-12-27 LAB — COMPREHENSIVE METABOLIC PANEL
ALBUMIN: 4.1 g/dL (ref 3.5–5.2)
ALT: 16 U/L (ref 0–35)
ANION GAP: 3 — AB (ref 5–15)
AST: 21 U/L (ref 0–37)
Alkaline Phosphatase: 54 U/L (ref 39–117)
BUN: <5 mg/dL — ABNORMAL LOW (ref 6–23)
CALCIUM: 9.5 mg/dL (ref 8.4–10.5)
CHLORIDE: 107 mmol/L (ref 96–112)
CO2: 30 mmol/L (ref 19–32)
Creatinine, Ser: 0.69 mg/dL (ref 0.50–1.10)
GFR calc Af Amer: >90 mL/min (ref >90)
GFR calc non Af Amer: >90 mL/min (ref >90)
Glucose, Bld: 96 mg/dL (ref 70–99)
Potassium: 4 mmol/L (ref 3.5–5.1)
Sodium: 140 mmol/L (ref 135–145)
Total Bilirubin: 0.5 mg/dL (ref 0.3–1.2)
Total Protein: 6.4 g/dL (ref 6.0–8.3)

## 2014-12-27 LAB — CBC WITH DIFFERENTIAL/PLATELET
Basophils Absolute: 0.1 10*3/uL (ref 0.0–0.1)
Basophils Relative: 1 % (ref 0–1)
Eosinophils Absolute: 0.1 10*3/uL (ref 0.0–0.7)
Eosinophils Relative: 1 % (ref 0–5)
HCT: 40.2 % (ref 36.0–46.0)
Hemoglobin: 13.3 g/dL (ref 12.0–15.0)
LYMPHS ABS: 1.2 10*3/uL (ref 0.7–4.0)
LYMPHS PCT: 16 % (ref 12–46)
MCH: 31.1 pg (ref 26.0–34.0)
MCHC: 33.1 g/dL (ref 30.0–36.0)
MCV: 93.9 fL (ref 78.0–100.0)
MONOS PCT: 5 % (ref 3–12)
Monocytes Absolute: 0.4 10*3/uL (ref 0.1–1.0)
NEUTROS ABS: 6 10*3/uL (ref 1.7–7.7)
NEUTROS PCT: 77 % (ref 43–77)
PLATELETS: 242 10*3/uL (ref 150–400)
RBC: 4.28 MIL/uL (ref 3.87–5.11)
RDW: 13.4 % (ref 11.5–15.5)
WBC: 7.7 10*3/uL (ref 4.0–10.5)

## 2014-12-27 LAB — MAGNESIUM: MAGNESIUM: 1.7 mg/dL (ref 1.5–2.5)

## 2014-12-27 LAB — POC URINE PREG, ED: PREG TEST UR: NEGATIVE

## 2014-12-27 MED ORDER — LORAZEPAM 2 MG/ML IJ SOLN
1.0000 mg | Freq: Once | INTRAMUSCULAR | Status: AC
  Administered 2014-12-27: 1 mg via INTRAVENOUS
  Filled 2014-12-27: qty 1

## 2014-12-27 MED ORDER — SODIUM CHLORIDE 0.9 % IV BOLUS (SEPSIS)
1000.0000 mL | Freq: Once | INTRAVENOUS | Status: AC
  Administered 2014-12-27: 1000 mL via INTRAVENOUS

## 2014-12-27 MED ORDER — MORPHINE SULFATE 4 MG/ML IJ SOLN
4.0000 mg | Freq: Once | INTRAMUSCULAR | Status: AC
  Administered 2014-12-27: 4 mg via INTRAVENOUS
  Filled 2014-12-27: qty 1

## 2014-12-27 MED ORDER — GADOBENATE DIMEGLUMINE 529 MG/ML IV SOLN
15.0000 mL | Freq: Once | INTRAVENOUS | Status: AC | PRN
  Administered 2014-12-27: 15 mL via INTRAVENOUS

## 2014-12-27 MED ORDER — HYDROMORPHONE HCL 1 MG/ML IJ SOLN
1.0000 mg | Freq: Once | INTRAMUSCULAR | Status: AC
  Administered 2014-12-27: 1 mg via INTRAVENOUS
  Filled 2014-12-27: qty 1

## 2014-12-27 NOTE — ED Provider Notes (Signed)
CSN: [id number]     Arrival date & time 12/27/14  1646 History   First MD Initiated Contact with Patient 12/27/14 1817     Chief Complaint  Patient presents with  . Numbness     (Consider location/radiation/quality/duration/timing/severity/associated sxs/prior Treatment) HPI  34 year old female presents with numbness and pain/spasm in her upper extremities for the past 3 days. Her symptoms have been constant since onset. The patient states her arms of "weak" this seems to be that her arms hurt to use. The patient states that her arms will feel like they're asleep and when she tries to shake them out or put pressure on them she seems to get diffuse pain and spasms. Discussed from her shoulders down to her fingers. It is diffuse. There've been no injuries. One to 2 months ago she had similar symptoms for 1 day and then it resolved. This is been constant for the past 3 days. No neck pain, stiffness, or headache. Has low back pain that did not come on at the same time. Patient rates her pain as severe.  Past Medical History  Diagnosis Date  . Lung nodules   . Fatty liver    Past Surgical History  Procedure Laterality Date  . Cholecystectomy     No family history on file. History  Substance Use Topics  . Smoking status: Current Every Day Smoker -- 15 years    Types: Cigarettes  . Smokeless tobacco: Not on file  . Alcohol Use: No   OB History    No data available     Review of Systems  Constitutional: Negative for fever.  Respiratory: Negative for shortness of breath.   Cardiovascular: Negative for chest pain.  Gastrointestinal: Negative for abdominal pain.  Musculoskeletal: Positive for back pain. Negative for neck pain.  Neurological: Positive for numbness. Negative for weakness and headaches.  All other systems reviewed and are negative.     Allergies  Zofran  Home Medications   Prior to Admission medications   Medication Sig Start Date End Date Taking? Authorizing  Provider  acetaminophen (TYLENOL) 500 MG tablet Take 1,000 mg by mouth every 6 (six) hours as needed for moderate pain.   Yes Historical Provider, MD  aspirin-acetaminophen-caffeine (EXCEDRIN MIGRAINE) [phone] MG per tablet Take 1 tablet by mouth every 6 (six) hours as needed for headache.   Yes Historical Provider, MD  ibuprofen (ADVIL,MOTRIN) 200 MG tablet Take 800 mg by mouth every 6 (six) hours as needed for mild pain or moderate pain.   Yes Historical Provider, MD  omeprazole (PRILOSEC) 20 MG capsule Take 1 capsule (20 mg total) by mouth 2 (two) times daily. 03/20/14  Yes Rolland Porter, MD  erythromycin ophthalmic ointment Place 1 application into the left eye every 6 (six) hours. Place 1/2 inch ribbon of ointment in the affected eye 4 times a day 03/10/14   Arthor Captain, PA-C  HYDROcodone-acetaminophen (NORCO) 5-325 MG per tablet Take 1-2 tablets by mouth every 6 (six) hours as needed for moderate pain. 03/10/14   Arthor Captain, PA-C  HYDROcodone-acetaminophen (NORCO/VICODIN) 5-325 MG per tablet Take 1 tablet by mouth every 4 (four) hours as needed. 03/20/14   Rolland Porter, MD  promethazine (PHENERGAN) 12.5 MG tablet Take 1 tablet (12.5 mg total) by mouth every 6 (six) hours as needed for nausea or vomiting. 03/20/14   Rolland Porter, MD  promethazine (PHENERGAN) 25 MG tablet Take 25 mg by mouth every 6 (six) hours as needed for nausea or vomiting.  Historical Provider, MD  sucralfate (CARAFATE) 1 G tablet Take 1 tablet (1 g total) by mouth 4 (four) times daily. 03/20/14   Rolland Porter, MD   BP 148/89 mmHg  Pulse 64  Temp(Src) 98.8 F (37.1 C) (Oral)  Resp 12  Ht 5\' 9"  (1.753 m)  Wt 162 lb (73.483 kg)  BMI 23.91 kg/m2  SpO2 100%  LMP 12/22/2014 Physical Exam  Constitutional: She is oriented to person, place, and time. She appears well-developed and well-nourished.  HENT:  Head: Normocephalic and atraumatic.  Right Ear: External ear normal.  Left Ear: External ear normal.  Nose: Nose normal.   Eyes: Right eye exhibits no discharge. Left eye exhibits no discharge.  Cardiovascular: Normal rate, regular rhythm and normal heart sounds.   Pulses:      Radial pulses are 2+ on the right side, and 2+ on the left side.  Pulmonary/Chest: Effort normal and breath sounds normal.  Abdominal: Soft. There is no tenderness.  Neurological: She is alert and oriented to person, place, and time. No cranial nerve deficit. GCS eye subscore is 4. GCS verbal subscore is 5. GCS motor subscore is 6.  Reflex Scores:      Bicep reflexes are 2+ on the right side and 2+ on the left side.      Brachioradialis reflexes are 2+ on the right side and 2+ on the left side.      Patellar reflexes are 2+ on the right side and 2+ on the left side. CN 2-12 grossly intact. Equal and strength in all 4 extremities. Unable to differentiate between sharp and dull on upper extremities.   Skin: Skin is warm and dry.  Nursing note and vitals reviewed.   ED Course  Procedures (including critical care time) Labs Review Labs Reviewed  COMPREHENSIVE METABOLIC PANEL - Abnormal; Notable for the following:    BUN <5 (*)    Anion gap 3 (*)    All other components within normal limits  CBC WITH DIFFERENTIAL/PLATELET  MAGNESIUM  POC URINE PREG, ED    Imaging Review Mr Laqueta Jean Wo Contrast  12/27/2014   CLINICAL DATA:  Intermittent bilateral upper extremity paresthesias for 3 days.  EXAM: MRI HEAD WITHOUT AND WITH CONTRAST  MRI CERVICAL SPINE WITHOUT AND WITH CONTRAST  TECHNIQUE: Multiplanar, multiecho pulse sequences of the brain and surrounding structures, and cervical spine, to include the craniocervical junction and cervicothoracic junction, were obtained without and with intravenous contrast.  CONTRAST:  15mL MULTIHANCE GADOBENATE DIMEGLUMINE 529 MG/ML IV SOLN  COMPARISON:  CT of the head March 10, 2014  FINDINGS: MRI HEAD FINDINGS  The ventricles and sulci are normal. No abnormal parenchymal signal, mass lesions, mass effect.  No abnormal parenchymal enhancement. No reduced diffusion to suggest acute ischemia nor hyperacute demyelination. No susceptibility artifact to suggest hemorrhage.  No abnormal extra-axial fluid collections. No extra-axial masses nor leptomeningeal enhancement. Normal major intracranial vascular flow voids seen at the skull base.  Ocular globes and orbital contents are unremarkable though not tailored for evaluation. No abnormal sellar expansion. Trace paranasal sinus mucosal thickening without air-fluid levels. The mastoid air cells are well aerated. No suspicious calvarial bone marrow signal. No abnormal sellar expansion. Craniocervical junction maintained.  MRI CERVICAL SPINE FINDINGS  Cervical vertebral bodies intact and aligned, straightened cervical lordosis. Intervertebral discs demonstrate normal morphology and signal characteristics. No abnormal STIR signal to suggest acute osseous process. No abnormal osseous or intradiscal enhancement.  Cervical spinal cord is normal in morphology and signal characteristics  from the craniocervical junction to level of T 3, the most caudal well visualized level. No abnormal cord, leptomeningeal or epidural enhancement. Craniocervical junction is intact. Included prevertebral and paraspinal soft tissues are normal.  Level by level evaluation: C2-3: Tiny central disc protrusion without canal stenosis or neural foraminal narrowing.  C3-4: Tiny central disc protrusion, annular bulging. No canal stenosis. Mild RIGHT neural foraminal narrowing.  C4-5: Tiny central disc protrusion without canal stenosis or neural foraminal narrowing. Faint enhancing annular fissure.  C5-6 through C7-T1: No disc bulge, canal stenosis nor neural foraminal narrowing. Faint enhancing annular fissure at C5-6.  IMPRESSION: MRI HEAD: No acute intracranial process. Normal MRI of the brain with and without contrast.  MRI CERVICAL SPINE: Straightened cervical lordosis without acute osseous process or canal  stenosis. No MR findings of demyelination.  Mild RIGHT C3-4 neural foraminal narrowing.   Electronically Signed   By: Awilda Metroourtnay  Bloomer   On: 12/27/2014 23:57   Mr Cervical Spine W Wo Contrast  12/27/2014   CLINICAL DATA:  Intermittent bilateral upper extremity paresthesias for 3 days.  EXAM: MRI HEAD WITHOUT AND WITH CONTRAST  MRI CERVICAL SPINE WITHOUT AND WITH CONTRAST  TECHNIQUE: Multiplanar, multiecho pulse sequences of the brain and surrounding structures, and cervical spine, to include the craniocervical junction and cervicothoracic junction, were obtained without and with intravenous contrast.  CONTRAST:  15mL MULTIHANCE GADOBENATE DIMEGLUMINE 529 MG/ML IV SOLN  COMPARISON:  CT of the head March 10, 2014  FINDINGS: MRI HEAD FINDINGS  The ventricles and sulci are normal. No abnormal parenchymal signal, mass lesions, mass effect. No abnormal parenchymal enhancement. No reduced diffusion to suggest acute ischemia nor hyperacute demyelination. No susceptibility artifact to suggest hemorrhage.  No abnormal extra-axial fluid collections. No extra-axial masses nor leptomeningeal enhancement. Normal major intracranial vascular flow voids seen at the skull base.  Ocular globes and orbital contents are unremarkable though not tailored for evaluation. No abnormal sellar expansion. Trace paranasal sinus mucosal thickening without air-fluid levels. The mastoid air cells are well aerated. No suspicious calvarial bone marrow signal. No abnormal sellar expansion. Craniocervical junction maintained.  MRI CERVICAL SPINE FINDINGS  Cervical vertebral bodies intact and aligned, straightened cervical lordosis. Intervertebral discs demonstrate normal morphology and signal characteristics. No abnormal STIR signal to suggest acute osseous process. No abnormal osseous or intradiscal enhancement.  Cervical spinal cord is normal in morphology and signal characteristics from the craniocervical junction to level of T 3, the most caudal  well visualized level. No abnormal cord, leptomeningeal or epidural enhancement. Craniocervical junction is intact. Included prevertebral and paraspinal soft tissues are normal.  Level by level evaluation: C2-3: Tiny central disc protrusion without canal stenosis or neural foraminal narrowing.  C3-4: Tiny central disc protrusion, annular bulging. No canal stenosis. Mild RIGHT neural foraminal narrowing.  C4-5: Tiny central disc protrusion without canal stenosis or neural foraminal narrowing. Faint enhancing annular fissure.  C5-6 through C7-T1: No disc bulge, canal stenosis nor neural foraminal narrowing. Faint enhancing annular fissure at C5-6.  IMPRESSION: MRI HEAD: No acute intracranial process. Normal MRI of the brain with and without contrast.  MRI CERVICAL SPINE: Straightened cervical lordosis without acute osseous process or canal stenosis. No MR findings of demyelination.  Mild RIGHT C3-4 neural foraminal narrowing.   Electronically Signed   By: Awilda Metroourtnay  Bloomer   On: 12/27/2014 23:57     EKG Interpretation None      MDM   Final diagnoses:  Bilateral arm pain  Paresthesias    Patient's bilateral arm  pain and paresthesias are of uncertain etiology. I discussed her case with Dr. Leroy Kennedy of neurology who recommends MRI with and without of brain and C-spine. These are both unremarkable. She is no focal weakness and has normal deep tendon reflexes. Normal radial pulses, I doubt acute arterial insufficiency. Could be a component of bradycardia not syndrome given that she states her hands turn red sometimes. However at this point she has been medically cleared and has no emergent findings. Will treat pain, give neurology follow-up, and recommended PCP follow-up for further testing as needed.    Pricilla Loveless, MD 12/28/14 [id number]

## 2014-12-27 NOTE — ED Notes (Signed)
Pt c/o bil arms and hands feeling like they are asleep off and on x's 3 days then her arms start aching and cramping

## 2014-12-27 NOTE — ED Notes (Signed)
Pt SpO2 below 90%.  Pt sleeping and recently given 1mg  dilaudid.  Pt placed on 2L O2 for transport to MRI.

## 2014-12-28 MED ORDER — HYDROCODONE-ACETAMINOPHEN 5-325 MG PO TABS: 1.0000 | ORAL_TABLET | Freq: Four times a day (QID) | ORAL | Status: AC | PRN

## 2014-12-28 NOTE — Discharge Instructions (Signed)
°Emergency Department Resource Guide °1) Find a Doctor and Pay Out of Pocket °Although you won't have to find out who is covered by your insurance plan, it is a good idea to ask around and get recommendations. You will then need to call the office and see if the doctor you have chosen will accept you as a new patient and what types of options they offer for patients who are self-pay. Some doctors offer discounts or will set up payment plans for their patients who do not have insurance, but you will need to ask so you aren't surprised when you get to your appointment. ° °2) Contact Your Local Health Department °Not all health departments have doctors that can see patients for sick visits, but many do, so it is worth a call to see if yours does. If you don't know where your local health department is, you can check in your phone book. The CDC also has a tool to help you locate your state's health department, and many state websites also have listings of all of their local health departments. ° °3) Find a Walk-in Clinic °If your illness is not likely to be very severe or complicated, you may want to try a walk in clinic. These are popping up all over the country in pharmacies, drugstores, and shopping centers. They're usually staffed by nurse practitioners or physician assistants that have been trained to treat common illnesses and complaints. They're usually fairly quick and inexpensive. However, if you have serious medical issues or chronic medical problems, these are probably not your best option. ° °No Primary Care Doctor: °- Call [facility] at  832-8000 - they can help you locate a primary care doctor that  accepts your insurance, provides certain services, etc. °- Physician Referral Service- 1-800-533-3463 ° °Chronic Pain Problems: °Organization         Address  Phone   Notes  °[facility]  (336) 297-2271 Patients need to be referred by their primary care doctor.  ° °Medication  Assistance: °Organization         Address  Phone   Notes  °[place] [facility] [address] °[place], [place] [address] (336) 641-8030 --Must be a resident of [place] °-- Must have NO insurance coverage whatsoever (no Medicaid/ Medicare, etc.) °-- The pt. MUST have a primary care doctor that directs their care regularly and follows them in the community °  °[facility]  (866) 331-1348   °[employer]  (888) 892-1162   ° °Agencies that provide inexpensive medical care: °Organization         Address  Phone   Notes  °[facility]  (336) 832-8035   °[facility]    (336) 832-7272   °[facility] [address] °[place], [place] [address] (336) 832-4777   °[facility] [address], °[place] (336) 271-4999   °[facility]    (336) 373-0678   °[facility]    (336) 272-1050   °[facility] ° [address], [place] Phone:  (336) 832-4444, Fax:  (336) 832-4440 Hours of Operation:  9 am - 6 pm, M-F.  Also accepts Medicaid/Medicare and self-pay.  °[facility] ° [address], [place] Phone: (336) 832-3150, Fax: (336) [phone]. Hours of Operation:  8:30 am - 5:30 pm, M-F.  Also accepts Medicaid and self-pay.  °[facility] [place] [address], [place] Phone: (336) 878-6027   °[facility] [address], [place], [place] (336)723-1848, Ext. 123 Mondays & Thursdays: 7-9 AM.  First 15 patients are seen on a first come, first serve basis. °  ° °Medicaid-accepting [place] Providers: ° °Organization         Address  Phone   Notes  °[facility] [address], [place] (336) 641-2100 Also accepts self-pay patients.  °[facility] [address], [place] ° (336) 856-9996   °[facility] [address], [place]  (336) 288-8857   °[facility] [address] [place] [address], [place] (336) 299-7000   °Veita Bland [address], [place]  ° (336) 373-1557 Only accepts [place] Access Medicaid patients after they have their name applied to their card.  ° °Self-Pay (no insurance) in [place]: ° °Organization         Address  Phone   Notes  °Sickle Cell Patients, [facility] [address], [place] (336) 832-1970   °[facility] [address], [place] (336) 832-4400   ° [facility] ° [address], [place] (336) 992-4800   °[facility]/Dr. Osei-Bonsu ° [address] [place] [address], [place] or [address], [place] (336) [phone] Phone number for both [place] and [place] locations is the same.  °[facility] [address], [place] (336) 299-0000   °[facility] [address] [place] [address], [place] or [address] (336) 852-7530 °(336) 878-2260   °[facility] [address], [place] (336) 350-1642, phone; (336) 294-5005, fax Sees patients 1st and 3rd Saturday of every month.  Must not qualify for public or private insurance (i.e. Medicaid, Medicare, [place] Health Choice, Veterans' Benefits) • Household income should be no more than 200% of the poverty level •The clinic cannot treat you if you are pregnant or think you are pregnant • Sexually transmitted diseases are not treated at the clinic.  ° ° °Dental Care: °Organization         Address  Phone  Notes  °[place] [facility] [address], [place] (336) 641-6152 Accepts children up to age 21 who are enrolled in Medicaid or [place] Health Choice; pregnant women with a Medicaid card; and children who have applied for Medicaid or [place] Health Choice, but were declined, whose parents can pay a reduced fee at time of service.  °[place]  [facility] [place]  [address], [place] (336) 641-7733 Accepts children up to age 21 who are enrolled in Medicaid or [place] Health Choice; pregnant women with a Medicaid card; and children who have applied for Medicaid or [place] Health Choice, but were declined, whose parents can pay a reduced fee at time of service.  °[facility] ° [address], [place] (336) 641-4533 Patients are seen by appointment only. Walk-ins are not accepted. [facility] will see patients 18 years of age and older. °Monday - Tuesday (8am-5pm) °Most Wednesdays (8:30-5pm) °$30 per visit, cash only  °[facility] ° [address], [place] (336) 641-4533 Patients are seen by appointment only. Walk-ins are not accepted. [facility] will see patients 18 years of age and older. °One   Wednesday Evening (Monthly: Volunteer Based).  $30 per visit, cash only  °[facility]  (919) 537-3737 for adults; Children under age 4, call Graduate Pediatric Dentistry at ([phone]) [phone]. Children aged 4-14, please call ([phone]) [phone] to request a pediatric application. ° Dental services are provided in all areas of dental care including fillings, crowns and bridges, complete and partial dentures, implants, gum treatment, root canals, and extractions. Preventive care is also provided. Treatment is provided to both adults and children. °Patients are selected via a lottery and there is often a waiting list. °  °[facility] [address], °[place] ° (336) 763-8833 [email or web address] °  °[facility] [address], [place], [place] (336)723-1848, Ext. 123 Second and Fourth Thursday of each month, opens at 6:30 AM; Clinic ends at 9 AM.  Patients are seen on a first-come first-served basis, and a limited number are seen during each clinic.  ° °[facility] ° [address], [place], [place] (336) 723-7904    Eligibility Requirements °You must have lived in [place], or [place] counties for at least the last three months. °  You cannot be eligible for state or federal sponsored healthcare insurance, including Veterans Administration, Medicaid, or Medicare. °  You generally cannot be eligible for healthcare insurance through your employer.  °  How to apply: °Eligibility screenings are held every Tuesday and Wednesday afternoon from 1:00 pm until 4:00 pm. You do not need an appointment for the interview!  °[facility] [address], [place], [place] 336-631-2330   °[facility]  336-342-8273   °[facility]  336-703-3100   °[facility]  336-570-6415   ° °Behavioral Health Resources in the Community: °Intensive Outpatient Programs °Organization         Address  Phone  Notes  °[facility] [address], [place], [place] 336-878-6098   °[facility] [address], [place], [place] 336-832-9800   °[facility] [address], [place], [place] ° 336-882-2125   °[facility] [address],  °[place], [place] 1-800-853-5163 or 336-641-4981   °Substance Abuse Resources °Organization         Address  Phone  Notes  °[facility]  336-882-2125   °[facility]  336-784-9470   °[facility]  336-285-9073   °[facility]  336-845-3988   °Residential & Outpatient Substance Abuse Program  1-800-659-3381   °Psychological Services °Organization         Address  Phone  Notes  °[facility]  336- 832-9600   °[facility]  336- 378-7881   °[facility] [address], [place] 1-800-853-5163 or 336-641-4981   ° °Mobile Crisis Teams °Organization         Address  Phone  Notes  °[facility], Mobile Crisis Care Unit  1-877-626-1772   °Assertive °[facility] ° [address]  [place], [place] 336-834-9664   °Sharon DeEsch [address] °[place] 336-554-5454   ° °Self-Help/Support Groups °Organization         Address  Phone             Notes  °[facility] - variety of support groups  336- 373-1402 Call for more information  °Narcotics Anonymous (NA), [facility] [address], °[place]  2 meetings at this location  ° °  Residential Treatment Programs °Organization         Address  Phone  Notes  °[facility] [address],    °[place]  1-866-801-8205   °[facility] ° [address], [place], [place] 704-293-8524   °[facility] [address], [place] 336-845-3988 Admissions: 8am-3pm M-F  °[facility] [address]    °[place], [place] 336-841-1104   °[facility] [address], [place], [place] 336-379-7146   °[facility] [address]  °[place], [place] 336-285-9073   °[facility] - Intensive Outpatient [address], [place], [place] 336-852-3033   °[facility] [address]  °[place], [place] 1-877-615-2722 or 336-784-9470   °[facility] [address] [place], [place] 336-227-7417 Accepts Medicaid  °[facility] [address]  °[place] 1-800-659-3381 Substance Abuse/Addiction Treatment  ° °[place] Behavioral Health Resources °Organization         Address  Phone  Notes  °[facility]  (888) 581-9988   °Julie Brannon, PhD [address] [place], [place]   (336) 349-5553 or (336) 951-0000   °[facility]   [address] °[place], [place] (336) 349-4454   °[facility] [address], [place], [place] (336) 342-8316 Insurance/Medicaid/sponsorship through [facility]  °[facility] [address]                                    [place], [place] (336) 342-8316 Therapy/tele-psych/case    °[facility] [address]  ° [place], [place] (336) 349-2233    °Dr. Arfeen  (336) 349-4544   °[facility] [place]  [employer] [place] [facility] 1) [address],  °2) [address], [place] °3)  [address], [place] (336) 349-3220 °(336) 342-7768 ° °(336) 342-8140   °[place] [facility] (336) 342-1394 or (336) 342-3537 (After Hours)    ° ° °

## 2017-01-08 IMAGING — MR MR HEAD WO/W CM
10 of 17 series · 25 of 48 positions shown · IV contrast (multihance)
Comparison: CT of the head March 10, 2014

CLINICAL DATA: Intermittent bilateral upper extremity paresthesias
for 3 days.

EXAM:
MRI HEAD WITHOUT AND WITH CONTRAST
MRI CERVICAL SPINE WITHOUT AND WITH CONTRAST
TECHNIQUE: Multiplanar, multiecho pulse sequences of the brain and surrounding
structures, and cervical spine, to include the craniocervical
junction and cervicothoracic junction, were obtained without and
with intravenous contrast.
CONTRAST:  15mL MULTIHANCE GADOBENATE DIMEGLUMINE 529 MG/ML IV SOLN

[Series 3: T2 post-contrast · coronal · 5.0mm · 0.39mm/px · 2 of 27 slices shown]
[im 1/27]
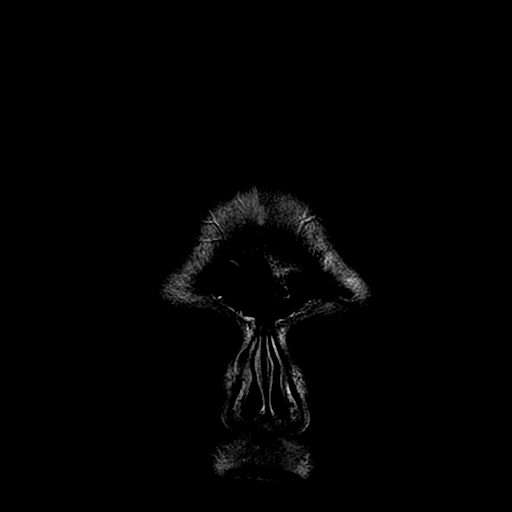
[im 27/27]
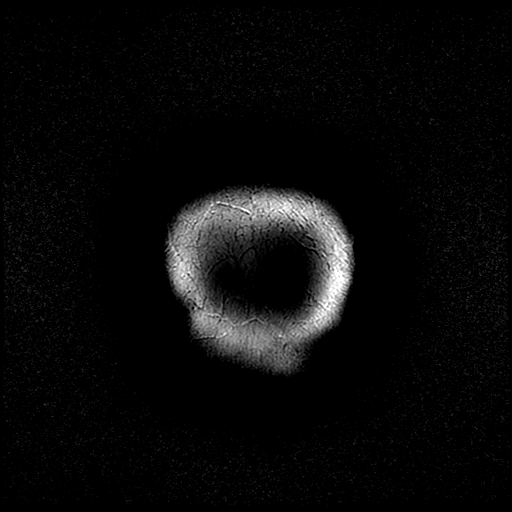

[Series 4: DWI · axial · 5.0mm · 1.09mm/px · z∈[-67,+78]mm · 4 of 60 slices shown (1 of 5)]
[im 1/60]
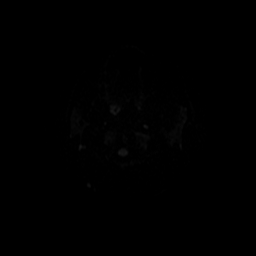
[im 20/60]
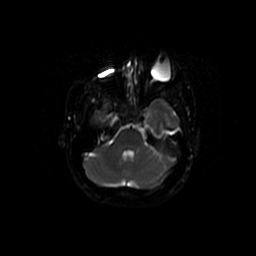
[im 40/60]
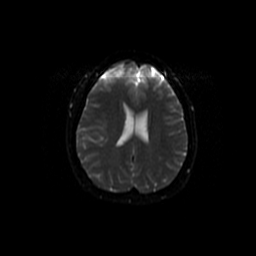
[im 60/60]
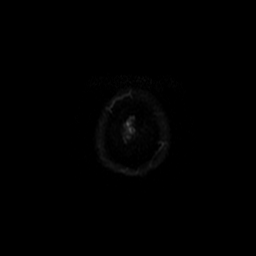

[Series 5: DWI · coronal · 5.0mm · 1.09mm/px · 3 of 68 slices shown (2 of 5)]
[im 1/68]
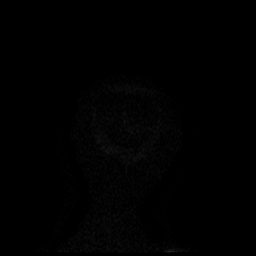
[im 34/68]
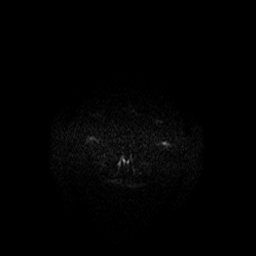
[im 68/68]
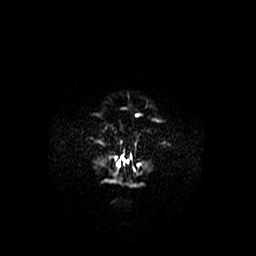

[Series 5: T1 post-contrast · coronal · 5.0mm · 0.39mm/px · 1 of 27 slices shown]
[im 1/27]
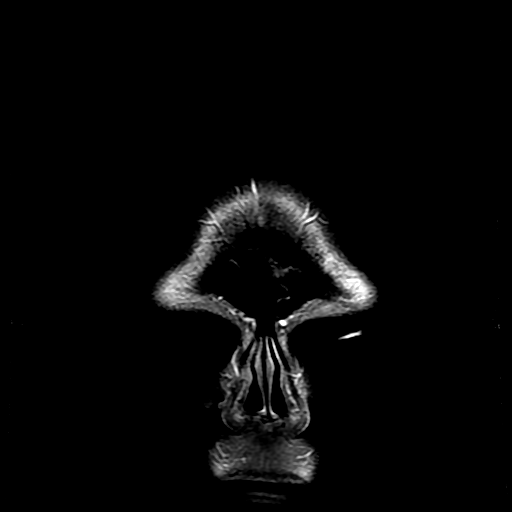

[Series 6: T2 · axial · 5.0mm · 0.43mm/px · 1 of 26 slices shown]
[im 1/26]
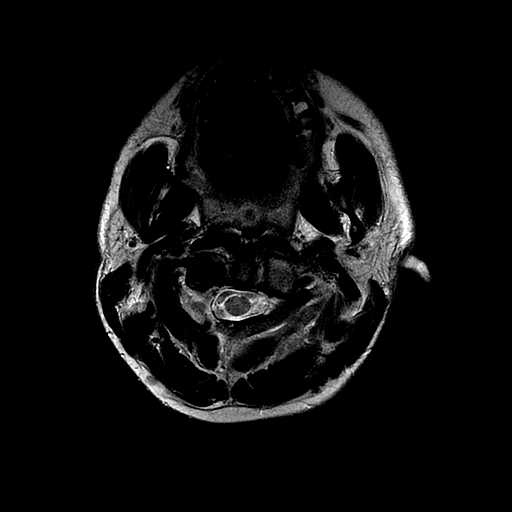

[Series 7: FLAIR · axial · 5.0mm · 0.43mm/px · 1 of 26 slices shown (1 of 2)]
[im 1/26]
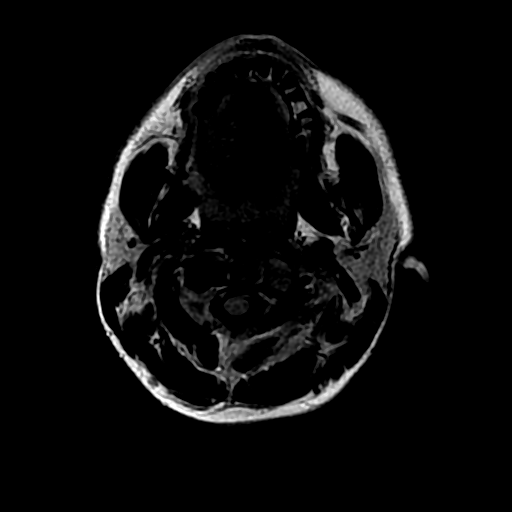

[Series 8: FLAIR · sagittal · 1.2mm · 0.49mm/px · 9 of 320 slices shown (2 of 2)]
[im 1/320]
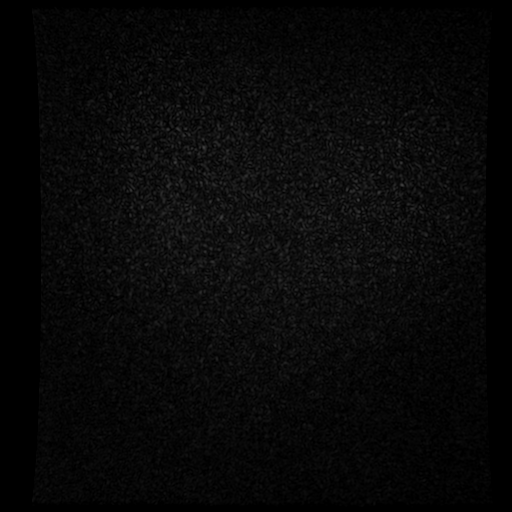
[im 46/320]
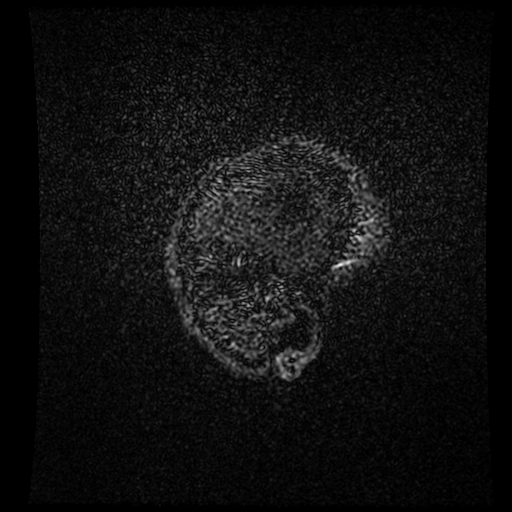
[im 92/320]
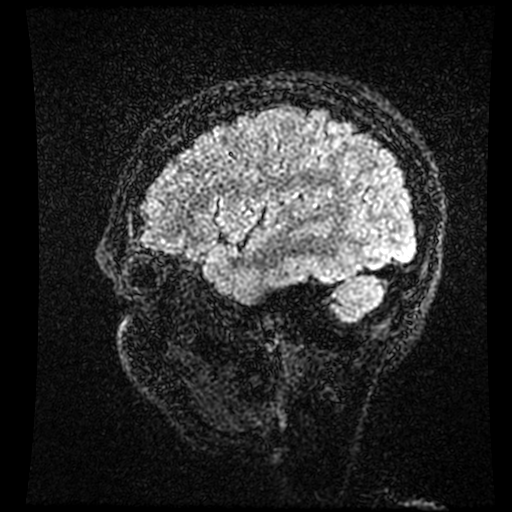
[im 137/320]
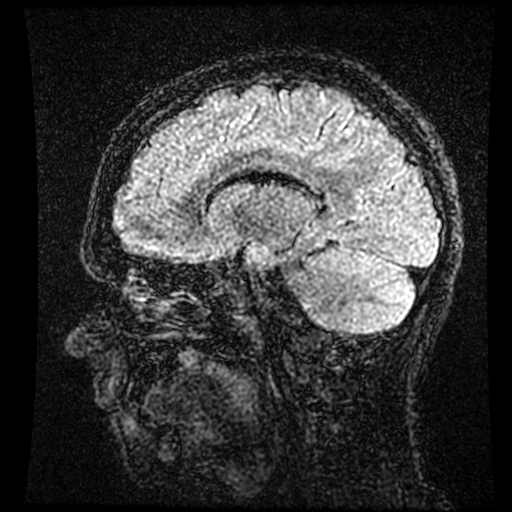
[im 160/320]
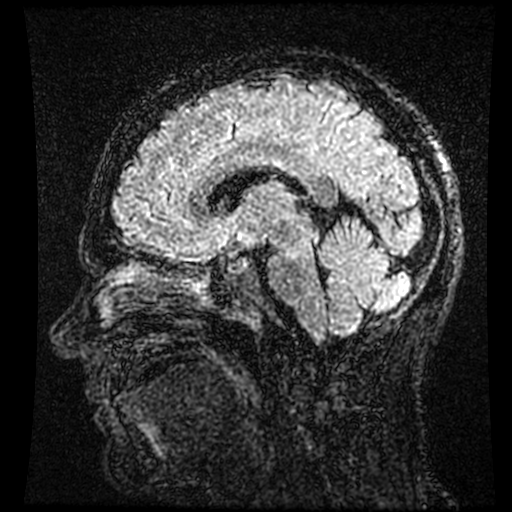
[im 183/320]
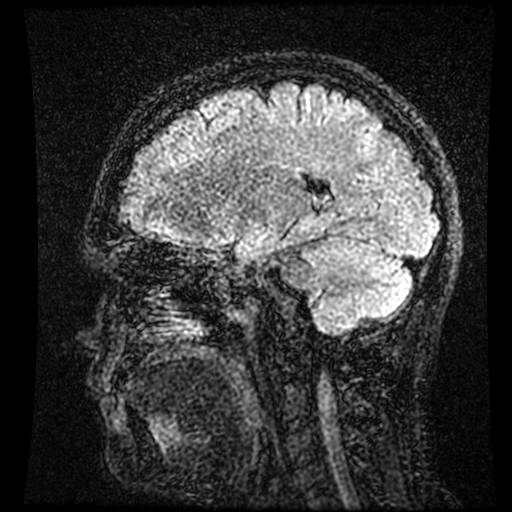
[im 228/320]
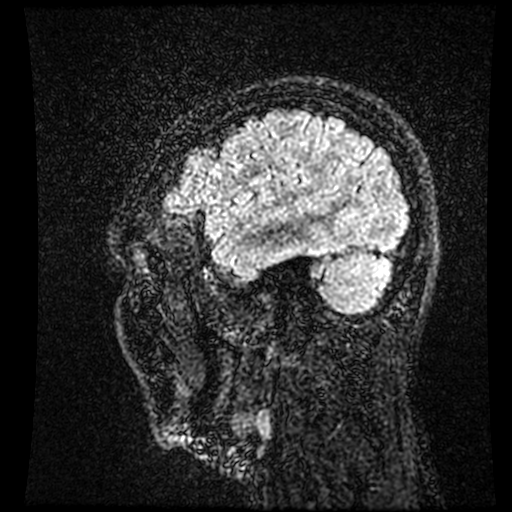
[im 274/320]
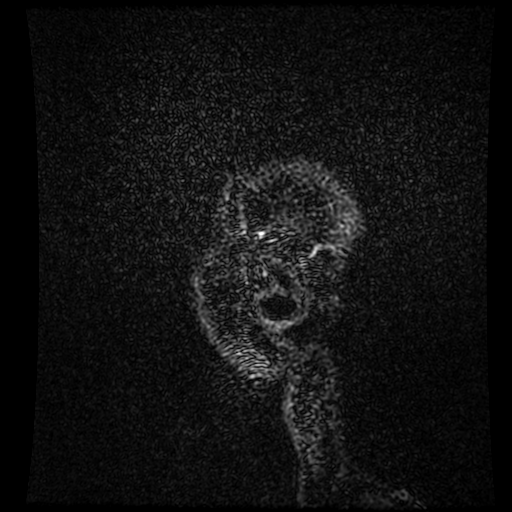
[im 320/320]
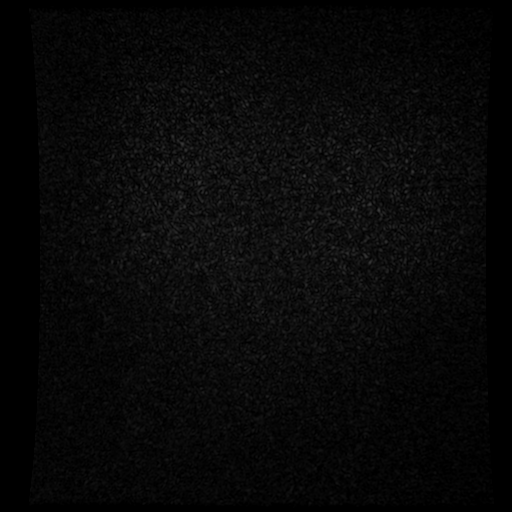

[Series 400: DWI · axial · 5.0mm · 1.09mm/px · 1 of 30 slices shown (3 of 5)]
[im 1/30]
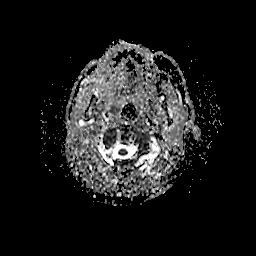

[Series 401: DWI · axial · 5.0mm · 1.09mm/px · 1 of 30 slices shown (4 of 5)]
[im 1/30]
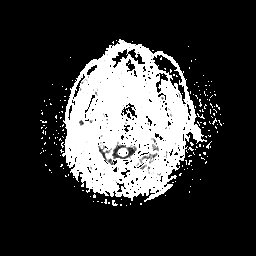

[Series 500: DWI · coronal · 5.0mm · 1.09mm/px · 2 of 34 slices shown (5 of 5)]
[im 1/34]
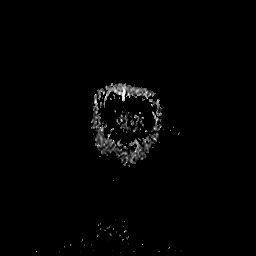
[im 34/34]
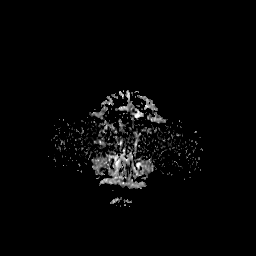

[25 of 48 positions shown; findings below may reference images not displayed]

FINDINGS: MRI HEAD FINDINGS

The ventricles and sulci are normal. No abnormal parenchymal signal,
mass lesions, mass effect. No abnormal parenchymal enhancement. No
reduced diffusion to suggest acute ischemia nor hyperacute
demyelination. No susceptibility artifact to suggest hemorrhage.

No abnormal extra-axial fluid collections. No extra-axial masses nor
leptomeningeal enhancement. Normal major intracranial vascular flow
voids seen at the skull base.

Ocular globes and orbital contents are unremarkable though not
tailored for evaluation. No abnormal sellar expansion. Trace
paranasal sinus mucosal thickening without air-fluid levels. The
mastoid air cells are well aerated. No suspicious calvarial bone
marrow signal. No abnormal sellar expansion. Craniocervical junction
maintained.

MRI CERVICAL SPINE FINDINGS

Cervical vertebral bodies intact and aligned, straightened cervical
lordosis. Intervertebral discs demonstrate normal morphology and
signal characteristics. No abnormal STIR signal to suggest acute
osseous process. No abnormal osseous or intradiscal enhancement.

Cervical spinal cord is normal in morphology and signal
characteristics from the craniocervical junction to level of T 3,
the most caudal well visualized level. No abnormal cord,
leptomeningeal or epidural enhancement. Craniocervical junction is
intact. Included prevertebral and paraspinal soft tissues are
normal.

Level by level evaluation: C2-3: Tiny central disc protrusion
without canal stenosis or neural foraminal narrowing.

C3-4: Tiny central disc protrusion, annular bulging. No canal
stenosis. Mild RIGHT neural foraminal narrowing.

C4-5: Tiny central disc protrusion without canal stenosis or neural
foraminal narrowing. Faint enhancing annular fissure.

C5-6 through C7-T1: No disc bulge, canal stenosis nor neural
foraminal narrowing. Faint enhancing annular fissure at C5-6.
IMPRESSION: MRI HEAD: No acute intracranial process. Normal MRI of the brain
with and without contrast.

MRI CERVICAL SPINE: Straightened cervical lordosis without acute
osseous process or canal stenosis. No MR findings of demyelination.

Mild RIGHT C3-4 neural foraminal narrowing.

  By: Sowjanya Weinberg
